# Patient Record
Sex: Male | Born: 1990 | Race: Black or African American | Hispanic: No | Marital: Single | Smoking: Current every day smoker
Health system: Southern US, Community
[De-identification: ages and names within clinical notes are randomized; demographics above are authoritative.]

---

## 2010-05-30 ENCOUNTER — Emergency Department: Payer: Self-pay | Admitting: Emergency Medicine
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
## 2018-02-03 ENCOUNTER — Other Ambulatory Visit: Payer: Self-pay

## 2018-02-03 ENCOUNTER — Encounter: Payer: Self-pay | Admitting: Emergency Medicine

## 2018-02-03 ENCOUNTER — Ambulatory Visit
Admission: EM | Admit: 2018-02-03 | Discharge: 2018-02-03 | Disposition: A | Discharge status: Home / Self Care | Payer: Self-pay | Attending: Family Medicine | Admitting: Family Medicine

## 2018-02-03 DIAGNOSIS — B9789 Other viral agents as the cause of diseases classified elsewhere: Secondary | ICD-10-CM

## 2018-02-03 DIAGNOSIS — J069 Acute upper respiratory infection, unspecified: Secondary | ICD-10-CM

## 2018-02-03 DIAGNOSIS — R05 Cough: Secondary | ICD-10-CM

## 2018-02-03 LAB — RAPID STREP SCREEN (MED CTR MEBANE ONLY): STREPTOCOCCUS, GROUP A SCREEN (DIRECT): NEGATIVE

## 2018-02-03 MED ORDER — HYDROCOD POLST-CPM POLST ER 10-8 MG/5ML PO SUER: 5.0000 mL | Freq: Every evening | ORAL | 0 refills | Status: AC | PRN

## 2018-02-03 NOTE — Discharge Instructions (Signed)
Take medication as prescribed. Rest. Drink plenty of fluids.  ° °Follow up with your primary care physician this week as needed. Return to Urgent care for new or worsening concerns.  ° °

## 2018-02-03 NOTE — ED Triage Notes (Signed)
Patient c/o cough, congestion and fever that started Saturday.

## 2018-02-03 NOTE — ED Provider Notes (Signed)
[facility] ____________________________________________  Time seen: Approximately 11:00 AM  I have reviewed the triage vital signs and the nursing notes.   HISTORY  Chief Complaint Cough and Fever   HPI Daniel Leach. is a 27 y.o. male presenting for evaluation of 3-4 days of cough, congestion, sore throat, some chills and body aches.  States earlier in the week felt like he was having more of a fever, but none in the last few days.  No over-the-counter medications taken today prior to arrival.  Has taken occasional Tylenol.  Reports both kids sick with similar complaints.  Reports decreased appetite but continues to drink fluids well.  States sore throat currently mild, and does have pain with swallowing.  States he has continued coughing, more so at night and does have some soreness in his chest with palpation as well as stretching. Denies any chest pain without movement.  Denies recent sickness.  Denies cardiac history.  Denies renal insufficiency.  Denies other aggravating or relieving factors.Denies current chest pain, shortness of breath, abdominal pain, dysuria, extremity pain, extremity swelling or rash. Denies recent sickness. Denies recent antibiotic use.    History reviewed. No pertinent past medical history.  There are no active problems to display for this patient.   History reviewed. No pertinent surgical history.   No current facility-administered medications for this encounter.   Current Outpatient Medications:  .  chlorpheniramine-HYDROcodone (TUSSIONEX PENNKINETIC ER) 10-8 MG/5ML SUER, Take 5 mLs by mouth at bedtime as needed for cough. do not drive or operate machinery while taking as can cause drowsiness., Disp: 75 mL, Rfl: 0  Allergies Patient has no known allergies.  History reviewed. No pertinent family history.  Social History Social History   Tobacco Use  . Smoking status: Current Every Day Smoker    Types: Cigars  . Smokeless  tobacco: Never Used  Substance Use Topics  . Alcohol use: Yes  . Drug use: Not on file    Review of Systems Constitutional: No fever/chills ENT: As above. Cardiovascular: Denies chest pain. Respiratory: Denies shortness of breath. Gastrointestinal: No abdominal pain.  No nausea, no vomiting.  Genitourinary: Negative for dysuria. Musculoskeletal: Negative for back pain. Skin: Negative for rash.  ____________________________________________   PHYSICAL EXAM:  VITAL SIGNS: ED Triage Vitals  Enc Vitals Group     BP 02/03/18 0949 133/81     Pulse Rate 02/03/18 0949 84     Resp 02/03/18 0949 16     Temp 02/03/18 0949 99.2 F (37.3 C)     Temp Source 02/03/18 0949 Oral     SpO2 02/03/18 0949 97 %     Weight 02/03/18 0947 175 lb (79.4 kg)     Height 02/03/18 0947 5\' 9"  (1.753 m)     Head Circumference --      Peak Flow --      Pain Score 02/03/18 0947 5     Pain Loc --      Pain Edu? --      Excl. in GC? --     Constitutional: Alert and oriented. Well appearing and in no acute distress. Eyes: Conjunctivae are normal.  Head: Atraumatic. No sinus tenderness to palpation. No swelling. No erythema.  Ears: no erythema, normal TMs bilaterally.   Nose:Nasal congestion with clear rhinorrhea  Mouth/Throat: Mucous membranes are moist. Mild pharyngeal erythema. No tonsillar swelling or exudate.  Neck: No stridor.  No cervical spine tenderness to palpation. Hematological/Lymphatic/Immunilogical: No cervical lymphadenopathy. Cardiovascular: Normal rate, regular rhythm.  Grossly normal heart sounds.  Good peripheral circulation. Respiratory: Normal respiratory effort.  No retractions. No wheezes, rales or rhonchi. Good air movement.  Musculoskeletal: Ambulatory with steady gait. No cervical, thoracic or lumbar tenderness to palpation.  Mild bilateral anterior chest wall tenderness to direct palpation, per patient same as reported pain. Neurologic:  Normal speech and language. No gait  instability. Skin:  Skin appears warm, dry and intact. No rash noted. Psychiatric: Mood and affect are normal. Speech and behavior are normal.  ___________________________________________   LABS (all labs ordered are listed, but only abnormal results are displayed)  Labs Reviewed  RAPID STREP SCREEN (NOT AT [facility])  CULTURE, GROUP A STREP [facility])    PROCEDURES Procedures    INITIAL IMPRESSION / ASSESSMENT AND PLAN / ED COURSE  Pertinent labs & imaging results that were available during my care of the patient were reviewed by me and considered in my medical decision making (see chart for details).  Well-appearing patient.  No acute distress.  Suspect viral URI with cough.  Anterior chest discomfort reproducible fully by direct palpation, no pain at rest, suspect muscle skeletal.  Out of timeframe for Tamiflu.  Encouraged rest, fluids, supportive care, Rx for as needed Tussionex given.  Quick strep negative.  Discussed indication, risks and benefits of medications with patient.  Discussed follow up with Primary care physician this week. Discussed follow up and return parameters including no resolution or any worsening concerns. Patient verbalized understanding and agreed to plan.   ____________________________________________   FINAL CLINICAL IMPRESSION(S) / ED DIAGNOSES  Final diagnoses:  Viral URI with cough     ED Discharge Orders        Ordered    chlorpheniramine-HYDROcodone (TUSSIONEX PENNKINETIC ER) 10-8 MG/5ML SUER  At bedtime PRN     02/03/18 1144       Note: This dictation was prepared with Dragon dictation along with smaller phrase technology. Any transcriptional errors that result from this process are unintentional.         Renford DillsMiller, Bailie Christenbury, NP 02/03/18 1152

## 2018-02-06 LAB — CULTURE, GROUP A STREP (THRC)
